# Patient Record
Sex: Male | Born: 2000 | Race: White | Hispanic: No | Marital: Single | Smoking: Never smoker
Health system: Southern US, Community
[De-identification: ages and names within clinical notes are randomized; demographics above are authoritative.]

---
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
## 2019-02-27 IMAGING — DX DG ANKLE COMPLETE 3+V*L*
4 series · 4 of 4 positions shown · non-contrast
Comparison: LEFT tibia and fibular radiographs December 20, 2016

CLINICAL DATA: LEFT ankle pain.

EXAM:
LEFT ANKLE COMPLETE - 3+ VIEW

[ankle obl (1 of 2)]
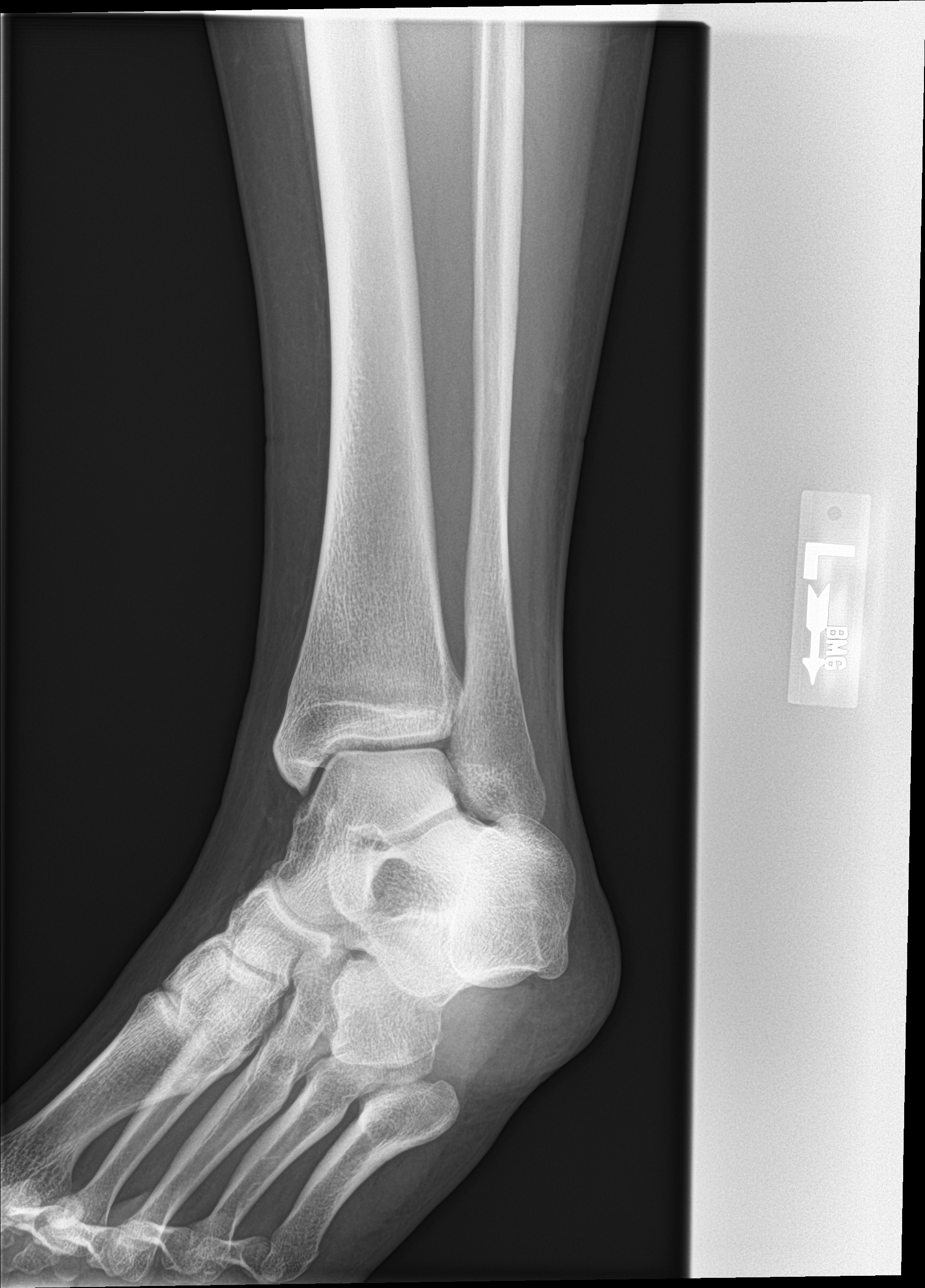

[ankle ap]
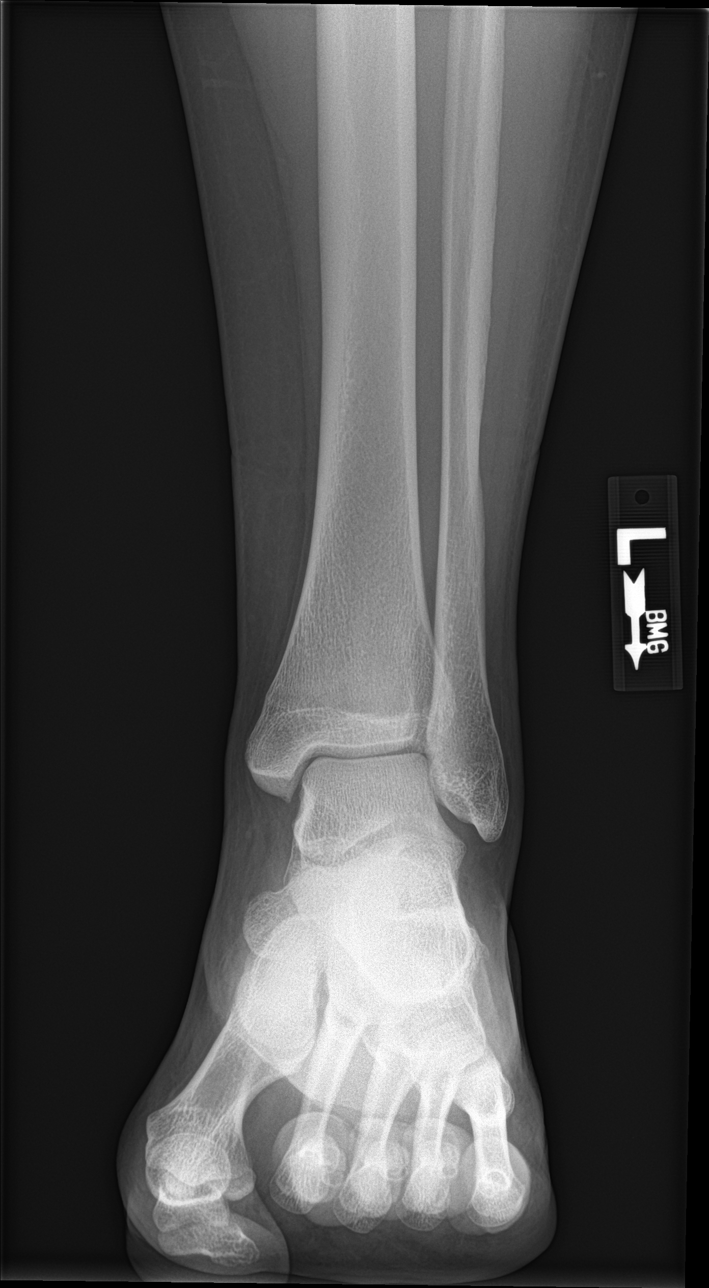

[ankle lat]
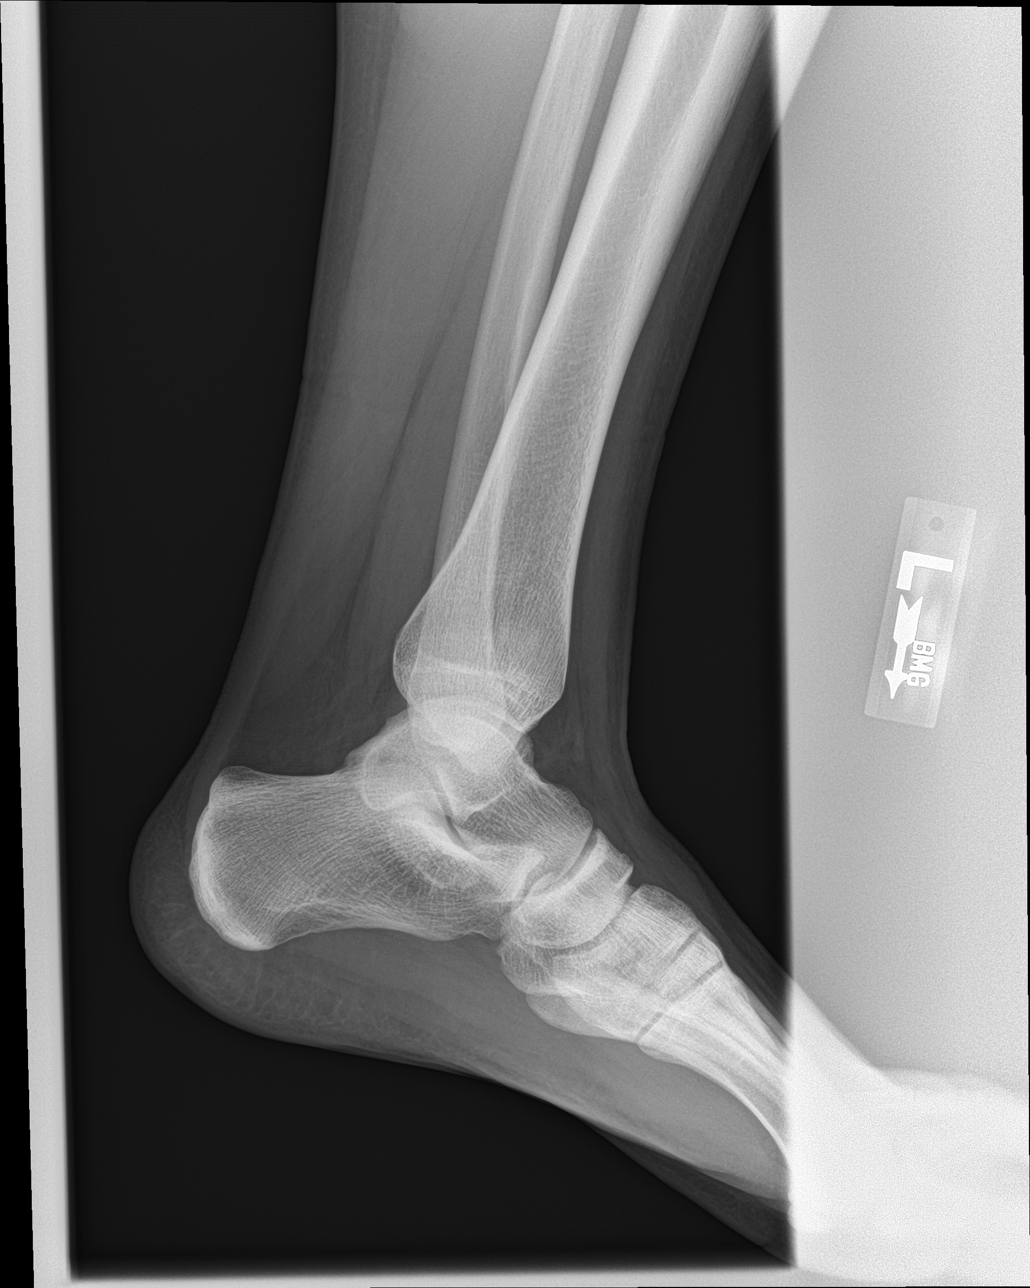

[ankle obl (2 of 2)]
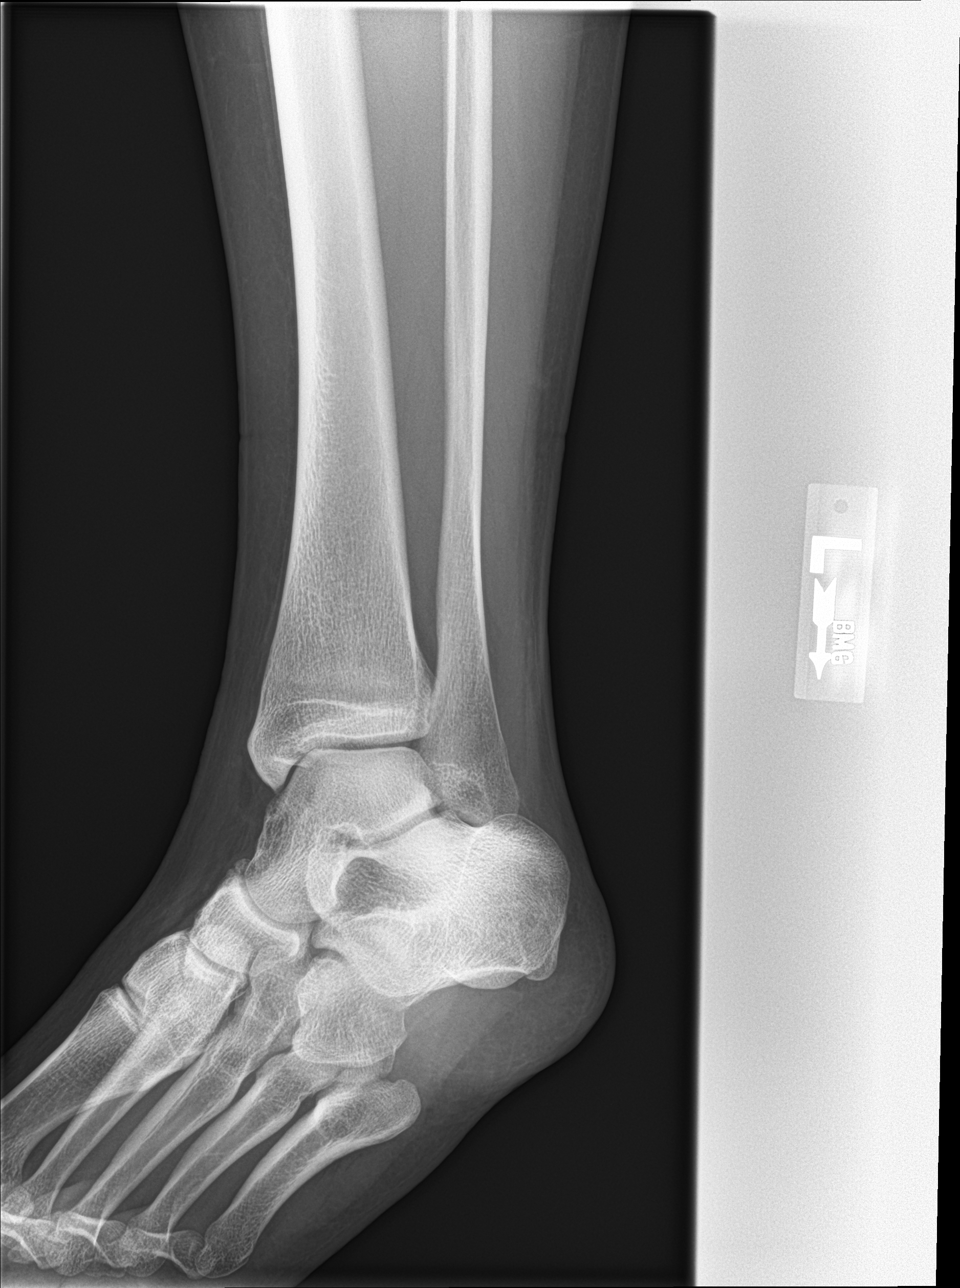

[4 of 4 positions shown; findings below may reference images not displayed]

FINDINGS: No fracture deformity nor dislocation. The ankle mortise appears
congruent and the tibiofibular syndesmosis intact. No destructive
bony lesions. Soft tissue planes are non-suspicious.
IMPRESSION: Negative.

## 2023-06-16 NOTE — Progress Notes (Signed)
 Jeremiah Castillo 03-Jan-2001 06/16/2023 Vitals:   06/16/23 0909  BP: 139/71  Pulse: 72  Resp: 20  Temp: 97.1 F (36.2 C)  TempSrc: Temporal  SpO2: 98%  Weight: (!) 138 kg (304 lb)  Height: 1.854 m (6' 1)   Chief Complaint  Patient presents with  . Headache    X 2 months intermittent, states that he was having them once a week but starting last they started coming daily. Pain can get up to 8/10    HPI: Headaches   FIRST or WORST headache ever? Can get up there DIFFERENT from usual headaches? Frequency, occasionally more intense   Onset =  2 months ago - once a week and now daily  Sudden, no prodrome   Palliation/Provocative  Stress, food, sleep : NO Caffeine use: none   Quality  Sharp frontal pain  Radiation: no  Associated symptoms  Nausea/vomiting: Yes Photo/phonophobia: no Vision changes: no Fever: No  Stiff neck: No  Confusion: No   Pain scale: 5/10 on avg 8/10 at its worse Medications tried for symptom relief: ibuprofen will help sometimes - 800 mg once a day  Can last an hour or two   Any imaging: no    ROS: A complete ROS was performed with positive/negative pertinent findings listed in the HPI. All other systems were negative.The following portions of the patient's history were reviewed and updated as appropriate: allergies, current medications, PFH, PMH, past social history, past surgical history and problem list.  Active Ambulatory Problems    Diagnosis Date Noted  . Mild intermittent asthma without complication 07/30/2016  . Perennial allergic rhinitis with seasonal variation 04/13/2019  . Cervical radiculopathy 07/13/2019  . Intrinsic atopic dermatitis 09/28/2019   Resolved Ambulatory Problems    Diagnosis Date Noted  . No Resolved Ambulatory Problems   No Additional Past Medical History    PHYSICAL EXAM: GENERAL: Well appearing, in NAD. Well nourished. HEAD: NCAT EYES: Conjunctiva clear without exudates. EOMI, no drainage. EARS:  Hearing grossly normal at conversation level. TM's intact, translucent w/o bulging, appropriate landmarks visualized. RESPIRATORY: Respirations even and non-labored. Breath sounds CTAB. No w/r/r/c. CARDIAC: Heart sounds clear, S1 S2, RRR. Neuro:  II-Visual fields grossly intact. III/IV/VI-Extraocular movements intact.  Pupils reactive bilaterally. V/VII-Smile symmetric, equal eyebrow raise, facial sensation intact VIII- Hearing grossly intact XI-bilateral shoulder shrug XII-midline tongue extension Motor: 5/5 bilaterally with normal tone and bulk Cerebellar: Normal finger-to-nose  and normal heel-to-shin test.   Romberg negative Ambulates with a coordinated gait    PLAN: 1. Worsening headaches (Primary) Can take meloxicam 15 mg once daily as needed for headaches  Will get MRI as a new and worsening headaches     Side effects and expectations of treatment plan/regimen reviewed. RTC for new/worrisome/unresolved symptoms. Pt voiced verbal understanding and agreement with treatment plan. All questions answered to pt's satisfaction.  No orders of the defined types were placed in this encounter.  No follow-ups on file.    Janie Diaz, PA-C  Wed 06/16/2023 9:37 AM

## 2023-06-17 NOTE — Telephone Encounter (Signed)
 Orders placed.

## 2023-06-17 NOTE — Telephone Encounter (Signed)
 What labs are needed prior to AE on 5/29?

## 2023-08-03 NOTE — Progress Notes (Signed)
 Chief Complaint  Patient presents with  . Headache    Patient referred for assessment of hx headaches and discuss current medication plan. Worsening migraines. Having daily and last all day.    Accompanied by: NA  Depression screening score: 4 Pain level  6-7/10   . New Patient    Patient is right handed, caffeine consumption 1 200mg  energy drink.   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 right handed White or Caucasian   Male  with daily headaches, bad X 2 months, migraines since age 37, some dizziness, has had head trauma in he past. Better lying down, doesn't wake up with HA, pressure after he gets up, not throbbing. Occipital to frontal, some neck pain X years, not bad.    The patient denies weakness,  numbness or tingling, nausea, vomiting, seizures or loss of consciousness, recent trauma. 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  Current Outpatient Medications:  .  acetaminophen (TYLENOL) 500 mg tablet, Take 1,000 mg by mouth every 8 (eight) hours as needed for fever 100.4 F or GREATER (pain)., Disp: 30 tablet, Rfl: 0 .  albuterol HFA (PROVENTIL HFA;VENTOLIN HFA;PROAIR HFA) 90 mcg/actuation inhaler, Inhale 2 puffs every 6 (six) hours as needed for wheezing., Disp: 18 g, Rfl: 3 .  butalbital-acetaminophen-caffeine (Fioricet) 50-300-40 mg cap capsule, Every four hours as needed for headache. Max of 6 capsules per day, Disp: 15 capsule, Rfl: 0 .  cetirizine (ZyrTEC) 10 mg tablet, Take 10 mg by mouth daily., Disp: , Rfl:  .  ibuprofen (MOTRIN) 800 mg tablet, Take 800 mg by mouth every 8 (eight) hours as needed for fever 100.4 F or GREATER (pain)., Disp: 30 tablet, Rfl: 0 .  meloxicam (MOBIC) 15 mg tablet, Take 1 tablet (15 mg total) by mouth daily., Disp: 60 tablet, Rfl: 0 .  ergocalciferol (Vitamin D2) 1,250 mcg  (50,000 unit) capsule, Take 1 capsule (50,000 Units total) by mouth once a week., Disp: 12 capsule, Rfl: 3 .  metoclopramide (REGLAN) 5 mg tablet, Take 1 tablet (5 mg total) by mouth every 6 (six) hours as needed (migraine). Max of 45 mg/day. (Patient not taking: Reported on 08/03/2023), Disp: 20 tablet, Rfl: 0 .  SUMAtriptan (IMITREX) 25 mg tablet, Take 1 tablet (25 mg total) by mouth once as needed for migraine. May repeat dose once in 2 hours if no relief.  Do not exceed 2 doses in 24 hours. (Patient not taking: Reported on 08/03/2023), Disp: 9 tablet, Rfl: 0   Past Medical History:  Diagnosis Date  . Cervical radiculopathy   . Intrinsic atopic dermatitis   . Mild intermittent asthma without complication (CMD)   . Perennial allergic rhinitis with seasonal variation      Past Surgical History:  Procedure Laterality Date  . NO PAST SURGERIES      Patient Active Problem List  Diagnosis  . Mild intermittent asthma without complication  . Perennial allergic rhinitis with seasonal variation  . Cervical radiculopathy  . Intrinsic atopic dermatitis  . Chronic daily headache  . Migraine without aura and without status migrainosus, not intractable    Allergies:  Patient has no known allergies.  Family History  Problem Relation Name Age of Onset  . Diabetes Mother    . Migraines Mother    . Heart disease Father    .  Migraines Maternal Grandmother    . Stroke Paternal Grandmother    . Heart disease Other    . Hyperlipidemia Other    . Hypertension Other    . Cancer Neg Hx    . Dementia Neg Hx           A complete ROS was performed with pertinent positives and negatives noted in the HPI, and all others were negative except for above.   BP 122/70 (BP Location: Left arm, Patient Position: Sitting)   Pulse 108   Ht 1.854 m (6' 1)   Wt (!) 143 kg (315 lb 6.4 oz)   SpO2 98%   BMI 41.61 kg/m     The patient is normocephalic and atraumatic, well nourished.  There were  no carotid bruits.  Mental status is normal: Awake, alert, oriented x 3, speech fluent, Memory, repetition, naming are intact. Normal fund of knowledge.  Cranial nerves 2 through 12 are intact. Pupils round, equal and reactive to light, visual fields full. The fundi are poorly visualized.SABRA Extra ocular movements are full with no nystagmus. Hearing is intact. The face is symmetric with no numbness. The tongue is midline and lower cranial nerves are normal. Shoulder shrug is strong.  Motor: All muscle groups were 5/5 in upper and lower extremities, with no pronator drift and good fine finger movements. Tone is normal and there is no tremor.  Sensation is intact to touch, pinprick, vibration, position sense and double simultaneous stimulation.  Cerebellar is without dysmetria. Gait is without ataxia. The patient can walk on heels and toes and do tandam gait. Romberg is negative.  Reflexes are 2+ in UEs, 2+ in knees and 2+ in ankles. Both toes are downgoing.  Frontal release signs are absent. Not tender over occipital area.   Results for orders placed or performed in visit on 04/17/23  POC Rapid Influenza A & B Antigen   Collection Time: 04/17/23  3:36 PM  Result Value Ref Range   Flu A Negative Negative   Flu B Negative Negative   Internal Control Acceptable    Kit/Device Lot # [id number]    Kit/Device Expiration Date 3697973    MRI brain 06/26/23:  Partially empty sella turcica. This finding can reflect incidental anatomic variation, or alternatively, it can be associated with chronic idiopathic intracranial hypertension (pseudotumor cerebri).     IMPRESSION:  Headaches, migraines Partial empty sella    PLAN:  LP under flouro for OP  Do not take Fioricet or Energy drinks Weight loss  Topamax 25 mg hs, increase to 4 hs.   Follow up 4 weeks

## 2023-09-03 NOTE — Progress Notes (Signed)
 Procedure:  Lumbar Puncture under Fluoroscopic guidance. Indications:  Evaluation for IIH.  Procedure, benefits, risks (including bleeding, infection, injury, and headache), and alternatives were explained to the patient who voiced understanding of the information and agreed to proceed with the procedure. Written consent signed and in the chart.  Description: The patient was brought to the fluoroscopy suite, where fluoroscopy guided needle placement was utilized. Patient was placed in the left lateral decubitus position. Area of interest prepped and draped in a sterile fashion. Local anesthetic was administered with 4 mL of 2% lidocaine.  The L3-4 interspace was entered with a 20 gauge spinal needle. Approximately 10 ml of clear CSF was removed and sent for study.   Opening pressure: 19.2 cm H2O.   Patient tolerated the procedure without complication. Patient was observed for a short period time and subsequently released in stable clinical condition with companion. They were instructed to contact the office at any time with any new interval changes.  Diagnostic Lumbar Spinal Puncture  Date/Time: 09/03/2023 10:00 AM  Performed by: Lirim Tonuzi, MD Authorized by: Lirim Tonuzi, MD

## 2023-09-08 NOTE — Progress Notes (Signed)
 Chief Complaint  Patient presents with  . Headache    Patient returns for assessment of hx headaches and discuss current medication plan. Overall doing better. Still having some bad headache days.   Accompanied by: NA  Depression screening score: 4 Pain level  4/10     The patient is a 23 y.o. right handed White or Caucasian   Male  with daily headaches, now better, not as frequent, migraines since age 42, some dizziness, has had head trauma in he past. Better lying down, doesn't wake up with HA, pressure after he gets up, not throbbing. Occipital to frontal, some neck pain X years, not bad. Taking Topamax 100 mg/d, helping some.  OP on LP 09/05/23 = 192.     The patient denies weakness,  numbness or tingling, nausea, vomiting, seizures or loss of consciousness, recent trauma.   Current Outpatient Medications:  .  acetaminophen (TYLENOL) 500 mg tablet, Take 1,000 mg by mouth every 8 (eight) hours as needed for fever 100.4 F or GREATER (pain)., Disp: 30 tablet, Rfl: 0 .  albuterol HFA (PROVENTIL HFA;VENTOLIN HFA;PROAIR HFA) 90 mcg/actuation inhaler, Inhale 2 puffs every 6 (six) hours as needed for wheezing., Disp: 18 g, Rfl: 3 .  cetirizine (ZyrTEC) 10 mg tablet, Take 10 mg by mouth daily., Disp: , Rfl:  .  ergocalciferol (Vitamin D2) 1,250 mcg (50,000 unit) capsule, Take 1 capsule (50,000 Units total) by mouth once a week., Disp: 12 capsule, Rfl: 3 .  ibuprofen (MOTRIN) 800 mg tablet, Take 800 mg by mouth every 8 (eight) hours as needed for fever 100.4 F or GREATER (pain)., Disp: 30 tablet, Rfl: 0 .  meloxicam (MOBIC) 15 mg tablet, Take 1 tablet (15 mg total) by mouth daily., Disp: 60 tablet, Rfl: 0 .  topiramate (TOPAMAX) 25 mg tablet, Start 1 hs, increase by 1 weekly to 4 hs, Disp: 120 tablet, Rfl: 1   Past Medical History:  Diagnosis Date  . Cervical radiculopathy   . Intrinsic atopic dermatitis   . Migraine   . Mild intermittent asthma without complication (CMD)   . Perennial  allergic rhinitis with seasonal variation      Past Surgical History:  Procedure Laterality Date  . NO PAST SURGERIES      Patient Active Problem List  Diagnosis  . Mild intermittent asthma without complication  . Perennial allergic rhinitis with seasonal variation  . Cervical radiculopathy  . Intrinsic atopic dermatitis  . Chronic daily headache  . Migraine without aura and without status migrainosus, not intractable  . Empty sella (CMD)    Allergies:  Patient has no known allergies.  A complete ROS was performed with pertinent positives and negatives noted in the HPI, and all others were negative except for above.  BP 114/76 (BP Location: Left arm, Patient Position: Sitting)   Pulse 95   Ht 1.854 m (6' 1)   Wt (!) 137 kg (301 lb 11.2 oz)   SpO2 98%   BMI 39.80 kg/m   Mental status normal, the patient is awake, alert, oriented x 3, with fluent speech, normal fund of knowledge.  No carotid bruits.  CN 2 - 12 are normal, pupils equal, extra-occular movements full, face symmetric with no numbness.  Motor: all muscle groups 5/5 in upper and lower extremities, with no drift, normal tone.  Sensation is intact to all modalities bilaterally.  Reflexes are 2+ in UEs, 2+ in knees and 2+ in ankles. Both toes are downgoing.   Cerebellar and gait are  without ataxia.    Results for orders placed or performed in visit on 09/03/23  CSF Culture   Collection Time: 09/03/23 10:31 AM   Specimen: Lumbar Puncture; Cerebrospinal Fluid  Result Value Ref Range   Spinal Fluid Culture No Growth    Gram Stain Result No PMNs seen.    Gram Stain Result No organisms seen    Gram Stain Result Cytospin Prep   Cell Count, Body Fluid   Collection Time: 09/03/23 10:31 AM  Result Value Ref Range   Source, Fluid Lumbar Puncture    Tube Number, CSF Tube 3    Appearance, Fluid Clear Clear   Color, Fluid Colorless Colorless   RBC, CSF 0 <=0 Cells/uL   Nucleated Cells, CSF 2 0 - 5 Cells/uL   Glucose, CSF   Collection Time: 09/03/23 10:31 AM  Result Value Ref Range   Glucose, CSF 60 40 - 70 mg/dL  Total Protein, CSF   Collection Time: 09/03/23 10:31 AM  Result Value Ref Range   Total Protein, CSF 54 (H) 15 - 45 mg/dL  VDRL, CSF   Collection Time: 09/03/23 10:31 AM  Result Value Ref Range   VDRL, CSF Non Reactive Non Rea:<1:1  Cell Count, Body Fluid   Collection Time: 09/03/23 10:31 AM  Result Value Ref Range   Source, Fluid Lumbar Puncture    Tube Number, CSF Tube 1    Appearance, Fluid Clear Clear   Color, Fluid Colorless Colorless   Supernatant Color, CSF     RBC, CSF 2 (H) <=0 Cells/uL   Nucleated Cells, CSF 0 0 - 5 Cells/uL  Sterile Container Hold   Collection Time: 09/03/23  2:12 PM   Specimen: CSF Reservoir; Cerebrospinal Fluid  Result Value Ref Range   AH Hold Specimen for Add-Ons Auto Resulted.     MRI brain 06/26/23:  Partially empty sella turcica. This finding can reflect incidental anatomic variation, or alternatively, it can be associated with chronic idiopathic intracranial hypertension (pseudotumor cerebri).   LP 09/03/23: OP = 192  IMPRESSION:  Headaches, migraines Partial empty sella     PLAN:  Increase Topamax to 50 mg, 3 a day    Follow up 2 months

## 2023-12-14 ENCOUNTER — Telehealth: Payer: Self-pay | Admitting: Neurology

## 2023-12-14 NOTE — Telephone Encounter (Signed)
 Called and left voicemail for patient to reschedule appointment on 02/07/24 with Dr Ines.  If patient calls back, they can be rescheduled with Dr. Vear

## 2024-01-30 NOTE — Progress Notes (Deleted)
 [facility]  PATIENT: Jeremiah Castillo DOB: October 14, 2000  REFERRING DOCTOR OR PCP:  *** SOURCE: ***  _________________________________   HISTORICAL  CHIEF COMPLAINT:  No chief complaint on file.   HISTORY OF PRESENT ILLNESS:  ***  On Topamax  Imaging: MRI of the brain 06/26/2023 showed a normal brain.  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  This is usually an incidental finding.  Couple small mucous retention cysts in the maxillary sinuses.  Lumbar puncture 09/03/2022 opening pressure of 19 cm.  CSF showed a mildly elevated protein.  Cell count was 5  REVIEW OF SYSTEMS: Constitutional: No fevers, chills, sweats, or change in appetite Eyes: No visual changes, double vision, eye pain Ear, nose and throat: No hearing loss, ear pain, nasal congestion, sore throat Cardiovascular: No chest pain, palpitations Respiratory:  No shortness of breath at rest or with exertion.   No wheezes GastrointestinaI: No nausea, vomiting, diarrhea, abdominal pain, fecal incontinence Genitourinary:  No dysuria, urinary retention or frequency.  No nocturia. Musculoskeletal:  No neck pain, back pain Integumentary: No rash, pruritus, skin lesions Neurological: as above Psychiatric: No depression at this time.  No anxiety Endocrine: No palpitations, diaphoresis, change in appetite, change in weigh or increased thirst Hematologic/Lymphatic:  No anemia, purpura, petechiae. Allergic/Immunologic: No itchy/runny eyes, nasal congestion, recent allergic reactions, rashes  ALLERGIES: No Known Allergies  HOME MEDICATIONS:  Current Outpatient Medications:    albuterol (PROVENTIL HFA;VENTOLIN HFA) 108 (90 Base) MCG/ACT inhaler, Inhale into the lungs., Disp: , Rfl:   PAST MEDICAL HISTORY: No past medical history on file.  PAST SURGICAL HISTORY: *** The histories are not reviewed yet. Please review them in the History navigator section and refresh this  SmartLink.  FAMILY HISTORY: No family history on file.  SOCIAL HISTORY: Social History   Socioeconomic History   Marital status: Single    Spouse name: Not on file   Number of children: Not on file   Years of education: Not on file   Highest education level: Not on file  Occupational History   Not on file  Tobacco Use   Smoking status: Never   Smokeless tobacco: Never  Substance and Sexual Activity   Alcohol use: No   Drug use: No   Sexual activity: Not on file  Other Topics Concern   Not on file  Social History Narrative   Not on file   Social Drivers of Health   Financial Resource Strain: Not on file  Food Insecurity: Low Risk  (06/16/2023)   Received from [facility]   Hunger Vital Sign    Within the past 12 months, you worried that your food would run out before you got money to buy more: Never true    Within the past 12 months, the food you bought just didn't last and you didn't have money to get more. : Never true  Transportation Needs: No Transportation Needs (06/16/2023)   Received from [facility]    In the past 12 months, has lack of reliable transportation kept you from medical appointments, meetings, work or from getting things needed for daily living? : No  Physical Activity: Not on file  Stress: Not on file  Social Connections: Not on file  Intimate Partner Violence: Not on file       PHYSICAL EXAM  There were no vitals filed for this visit.  There is no height or weight on file to calculate BMI.   General: The patient  is well-developed and well-nourished and in no acute distress  HEENT:  Head is [place]/AT.  Sclera are anicteric.  Funduscopic exam shows normal optic discs and retinal vessels.  Neck: No carotid bruits are noted.  The neck is nontender.  Cardiovascular: The heart has a regular rate and rhythm with a normal S1 and S2. There were no murmurs, gallops or rubs.    Skin: Extremities are without rash or   edema.  Musculoskeletal:  Back is nontender  Neurologic Exam  Mental status: The patient is alert and oriented x 3 at the time of the examination. The patient has apparent normal recent and remote memory, with an apparently normal attention span and concentration ability.   Speech is normal.  Cranial nerves: Extraocular movements are full. Pupils are equal, round, and reactive to light and accomodation.  Visual fields are full.  Facial symmetry is present. There is good facial sensation to soft touch bilaterally.Facial strength is normal.  Trapezius and sternocleidomastoid strength is normal. No dysarthria is noted.  The tongue is midline, and the patient has symmetric elevation of the soft palate. No obvious hearing deficits are noted.  Motor:  Muscle bulk is normal.   Tone is normal. Strength is  5 / 5 in all 4 extremities.   Sensory: Sensory testing is intact to pinprick, soft touch and vibration sensation in all 4 extremities.  Coordination: Cerebellar testing reveals good finger-nose-finger and heel-to-shin bilaterally.  Gait and station: Station is normal.   Gait is normal. Tandem gait is normal. Romberg is negative.   Reflexes: Deep tendon reflexes are symmetric and normal bilaterally.   Plantar responses are flexor.    DIAGNOSTIC DATA (LABS, IMAGING, TESTING) - I reviewed patient records, labs, notes, testing and imaging myself where available.  No results found for: WBC, HGB, HCT, MCV, PLT No results found for: NA, K, CL, CO2, GLUCOSE, BUN, CREATININE, CALCIUM, PROT, ALBUMIN, AST, ALT, ALKPHOS, BILITOT, GFRNONAA, GFRAA No results found for: CHOL, HDL, LDLCALC, LDLDIRECT, TRIG, CHOLHDL No results found for: YHAJ8R No results found for: VITAMINB12 No results found for: TSH     ASSESSMENT AND PLAN  ***   [name] A. Vear, MD, [facility] 01/30/2024, 3:07 PM Certified in Neurology, Clinical Neurophysiology, Sleep  Medicine and Neuroimaging  [facility] [address], [address] [place], [place] [address] [phone]

## 2024-01-31 ENCOUNTER — Encounter: Payer: Self-pay | Admitting: Neurology

## 2024-01-31 ENCOUNTER — Ambulatory Visit: Admitting: Neurology

## 2024-02-07 ENCOUNTER — Ambulatory Visit: Admitting: Neurology
